# Patient Record
Sex: Male | Born: 2000 | Race: Black or African American | Hispanic: No | Marital: Single | Smoking: Never smoker
Health system: Southern US, Community
[De-identification: ages and names within clinical notes are randomized; demographics above are authoritative.]

## PROBLEM LIST (undated history)

## (undated) DIAGNOSIS — J45909 Unspecified asthma, uncomplicated: Secondary | ICD-10-CM

---
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
## 2021-11-10 ENCOUNTER — Other Ambulatory Visit: Payer: Self-pay

## 2021-11-10 ENCOUNTER — Emergency Department (HOSPITAL_BASED_OUTPATIENT_CLINIC_OR_DEPARTMENT_OTHER)
Admission: EM | Admit: 2021-11-10 | Discharge: 2021-11-10 | Disposition: A | Discharge status: Home / Self Care | Payer: Self-pay | Attending: Emergency Medicine | Admitting: Emergency Medicine

## 2021-11-10 ENCOUNTER — Emergency Department (HOSPITAL_BASED_OUTPATIENT_CLINIC_OR_DEPARTMENT_OTHER): Payer: Self-pay

## 2021-11-10 ENCOUNTER — Encounter (HOSPITAL_BASED_OUTPATIENT_CLINIC_OR_DEPARTMENT_OTHER): Payer: Self-pay

## 2021-11-10 DIAGNOSIS — R051 Acute cough: Secondary | ICD-10-CM

## 2021-11-10 DIAGNOSIS — J45909 Unspecified asthma, uncomplicated: Secondary | ICD-10-CM | POA: Insufficient documentation

## 2021-11-10 DIAGNOSIS — R059 Cough, unspecified: Secondary | ICD-10-CM

## 2021-11-10 DIAGNOSIS — Z20822 Contact with and (suspected) exposure to covid-19: Secondary | ICD-10-CM | POA: Insufficient documentation

## 2021-11-10 DIAGNOSIS — Z28310 Unvaccinated for covid-19: Secondary | ICD-10-CM | POA: Insufficient documentation

## 2021-11-10 DIAGNOSIS — Z91010 Allergy to peanuts: Secondary | ICD-10-CM | POA: Insufficient documentation

## 2021-11-10 DIAGNOSIS — J069 Acute upper respiratory infection, unspecified: Secondary | ICD-10-CM

## 2021-11-10 DIAGNOSIS — R040 Epistaxis: Secondary | ICD-10-CM | POA: Insufficient documentation

## 2021-11-10 LAB — RESP PANEL BY RT-PCR (FLU A&B, COVID) ARPGX2
Influenza A by PCR: NEGATIVE
Influenza B by PCR: NEGATIVE
SARS Coronavirus 2 by RT PCR: NEGATIVE

## 2021-11-10 LAB — GROUP A STREP BY PCR: Group A Strep by PCR: NOT DETECTED

## 2021-11-10 MED ORDER — IBUPROFEN 800 MG PO TABS
800.0000 mg | ORAL_TABLET | Freq: Once | ORAL | Status: AC
  Administered 2021-11-10: 800 mg via ORAL
  Filled 2021-11-10: qty 1

## 2021-11-10 MED ORDER — ALBUTEROL SULFATE HFA 108 (90 BASE) MCG/ACT IN AERS
2.0000 | INHALATION_SPRAY | Freq: Once | RESPIRATORY_TRACT | Status: AC
  Administered 2021-11-10: 2 via RESPIRATORY_TRACT
  Filled 2021-11-10: qty 6.7

## 2021-11-10 MED ORDER — AEROCHAMBER PLUS FLO-VU MEDIUM MISC: 1.0000 | Freq: Once | Status: DC

## 2021-11-10 NOTE — ED Provider Notes (Signed)
[facility] EMERGENCY DEPARTMENT Provider Note   CSN: [id number] Arrival date & time: 11/10/21  1215     History Chief Complaint  Patient presents with   Cough    John Park is a 20 y.o. male who presents with 1 week of runny nose, sore throat, nonproductive cough, body aches, and subjective fever.  He denies any nausea, vomiting, diarrhea.  History of asthma but is not on medications daily.  States he is never had an albuterol inhaler.  He does state that he had significant of blood that he coughed up this morning, however this occurred simultaneously to a nosebleed during which time he felt blood running down the back of his throat.  Favor nosebleed with oral expulsion of the blood rather than true hemoptysis.  No influenza or COVID-19 vaccination this year. HPI     Past Medical History:  Diagnosis Date   Asthma     There are no problems to display for this patient.   History reviewed. No pertinent surgical history.     No family history on file.  Social History   Tobacco Use   Smoking status: Never   Smokeless tobacco: Never  Vaping Use   Vaping Use: Never used  Substance Use Topics   Alcohol use: No   Drug use: No    Home Medications Prior to Admission medications   Medication Sig Start Date End Date Taking? Authorizing Provider  diphenhydrAMINE (BENADRYL) 25 mg capsule Take 1 capsule (25 mg total) by mouth every 6 (six) hours as needed. 01/15/17   Rolland Porter, MD  predniSONE (DELTASONE) 20 MG tablet Take 1 tablet (20 mg total) by mouth 2 (two) times daily with a meal. 01/15/17   Rolland Porter, MD    Allergies    Peanut-containing drug products and Strawberry extract  Review of Systems   Review of Systems  Constitutional:  Positive for activity change, appetite change, chills, fatigue and fever.  HENT:  Positive for congestion, nosebleeds, rhinorrhea and sore throat. Negative for ear pain.   Eyes: Negative.   Respiratory:  Positive for  cough. Negative for choking and shortness of breath.   Cardiovascular: Negative.   Gastrointestinal: Negative.   Genitourinary: Negative.   Musculoskeletal:  Positive for myalgias. Negative for neck pain and neck stiffness.  Skin: Negative.   Neurological:  Positive for headaches. Negative for dizziness, syncope and light-headedness.   Physical Exam Updated Vital Signs BP 112/80   Pulse 89   Temp 100.3 F (37.9 C)   Resp 17   Ht 5\' 11"  (1.803 m)   Wt 70.3 kg   SpO2 99%   BMI 21.62 kg/m   Physical Exam Vitals and nursing note reviewed.  Constitutional:      Appearance: He is not ill-appearing or toxic-appearing.  HENT:     Head: Normocephalic and atraumatic.     Nose: Rhinorrhea present. No congestion. Rhinorrhea is clear.     Left Nostril: Epistaxis present. No septal hematoma.     Mouth/Throat:     Mouth: Mucous membranes are moist.     Pharynx: Oropharynx is clear. Uvula midline. Posterior oropharyngeal erythema present. No oropharyngeal exudate.     Tonsils: No tonsillar exudate.  Eyes:     General: Lids are normal. Vision grossly intact.        Right eye: No discharge.        Left eye: No discharge.     Extraocular Movements: Extraocular movements intact.     Conjunctiva/sclera: Conjunctivae  normal.  Neck:     Trachea: Trachea and phonation normal.  Cardiovascular:     Rate and Rhythm: Normal rate and regular rhythm.     Pulses: Normal pulses.     Heart sounds: Normal heart sounds. No murmur heard. Pulmonary:     Effort: Pulmonary effort is normal. No tachypnea, bradypnea, accessory muscle usage, prolonged expiration, respiratory distress or retractions.     Breath sounds: Examination of the right-middle field reveals wheezing. Examination of the left-middle field reveals wheezing. Examination of the right-lower field reveals wheezing. Examination of the left-lower field reveals wheezing. Wheezing present. No rales.  Chest:     Chest wall: No mass, lacerations,  deformity, swelling, tenderness, crepitus or edema.  Abdominal:     General: Bowel sounds are normal. There is no distension.     Palpations: Abdomen is soft.     Tenderness: There is no abdominal tenderness. There is no right CVA tenderness, left CVA tenderness, guarding or rebound.  Musculoskeletal:        General: No deformity.     Cervical back: Normal range of motion and neck supple. No edema, rigidity, tenderness or crepitus. No pain with movement, spinous process tenderness or muscular tenderness.     Right lower leg: No edema.     Left lower leg: No edema.  Lymphadenopathy:     Cervical: No cervical adenopathy.  Skin:    General: Skin is warm and dry.     Capillary Refill: Capillary refill takes less than 2 seconds.     Findings: No rash.  Neurological:     General: No focal deficit present.     Mental Status: He is alert and oriented to person, place, and time. Mental status is at baseline.     GCS: GCS eye subscore is 4. GCS verbal subscore is 5. GCS motor subscore is 6.     Gait: Gait is intact. Gait normal.  Psychiatric:        Mood and Affect: Mood normal.    ED Results / Procedures / Treatments   Labs (all labs ordered are listed, but only abnormal results are displayed) Labs Reviewed  RESP PANEL BY RT-PCR (FLU A&B, COVID) [id number]  GROUP A STREP BY PCR    EKG None  Radiology DG Chest Port 1 View  Result Date: 11/10/2021 CLINICAL DATA:  Flu-like symptoms for 1 week EXAM: PORTABLE CHEST 1 VIEW COMPARISON:  Chest radiograph 03/04/2015 FINDINGS: The cardiomediastinal silhouette is normal. There is no focal consolidation or pulmonary edema. There is no pleural effusion or pneumothorax. There is no acute osseous abnormality. IMPRESSION: No radiographic evidence of acute cardiopulmonary process. Electronically Signed   By: Lesia Hausen M.D.   On: 11/10/2021 13:43    Procedures Procedures   Medications Ordered in ED Medications  ibuprofen (ADVIL) tablet 800 mg (800  mg Oral Given 11/10/21 1354)  albuterol (VENTOLIN HFA) 108 (90 Base) MCG/ACT inhaler 2 puff (2 puffs Inhalation Given 11/10/21 1357)    ED Course  I have reviewed the triage vital signs and the nursing notes.  Pertinent labs & imaging results that were available during my care of the patient were reviewed by me and considered in my medical decision making (see chart for details).    MDM Rules/Calculators/A&P                         20-year male presents with 6 days of runny nose, sore throat, cough, fatigue, myalgia, and  fever.  Febrile on intake to 100.8 F.  Ibuprofen ordered.  Medicines otherwise normal.  Cardiopulmonary exam revealed diffuse wheezing throughout the mid and lower lung fields bilaterally without expiratory phase prolongation or rhonchi.  Clear rhinorrhea.  Evidence of mild epistaxis in the left naris, hemostatic at this time.  Mild posterior pharyngeal erythema without exudate or edema to suggest oropharyngeal abscess.  Chest x-ray negative for acute cardiopulmonary disease.  Respiratory pathogen panel negative, strep test negative.  Patient administered albuterol with significant improvement in his chest tightness.  On reevaluation patient longer has wheezing in the lung fields and states he is breathing much more comfortably.  May continue to use albuterol at home 2 puffs every 4 hours as necessary.  May also continue Tylenol and ibuprofen as needed for fever and discomfort.  Suspect he has other acute viral URI, no further work-up warranted in the ER at this time given reassuring vital signs and physical exam.  Shemuel voiced understanding of the medical evaluation and treatment plan.  Each of his questions was answered to his expressed satisfaction.  Return precautions were given.  Patient is well-appearing, stable, and appropriate discharge at this time.  This chart was dictated using voice recognition software, Dragon. Despite the best efforts of this provider to  proofread and correct errors, errors may still occur which can change documentation meaning.  Final Clinical Impression(s) / ED Diagnoses Final diagnoses:  Upper respiratory tract infection, unspecified type    Rx / DC Orders ED Discharge Orders     None        Sherrilee Gilles 11/10/21 1553    Terrilee Files, MD 11/10/21 1806

## 2021-11-10 NOTE — Discharge Instructions (Signed)
You are seen in the ER today for your cough, runny nose, and fever.  Your physical exam and vital signs were very reassuring.  You tested negative for COVID-19, influenza.  Suspect you have a different acute viral upper respiratory infection.  You were found to be wheezing in the ER today and were given an albuterol inhaler.  He may continue to use this as needed at home with 2 puffs every 4 hours as needed for your symptoms.  You may take Tylenol and ibuprofen as needed for fever or discomfort.  Come to the ER again if you develop any difficulty breathing, nausea or vomiting that does not stop, or any other new severe symptoms.

## 2021-11-10 NOTE — ED Triage Notes (Signed)
Pt c/o flu like sx x 1week-states he did have some blood in sputum-NAD-steady gait

## 2023-05-27 IMAGING — DX DG CHEST 1V PORT
1 series · 1 of 1 positions shown · non-contrast
Comparison: Chest radiograph 03/04/2015

CLINICAL DATA: Flu-like symptoms for 1 week

EXAM:
PORTABLE CHEST 1 VIEW

[chest ap]
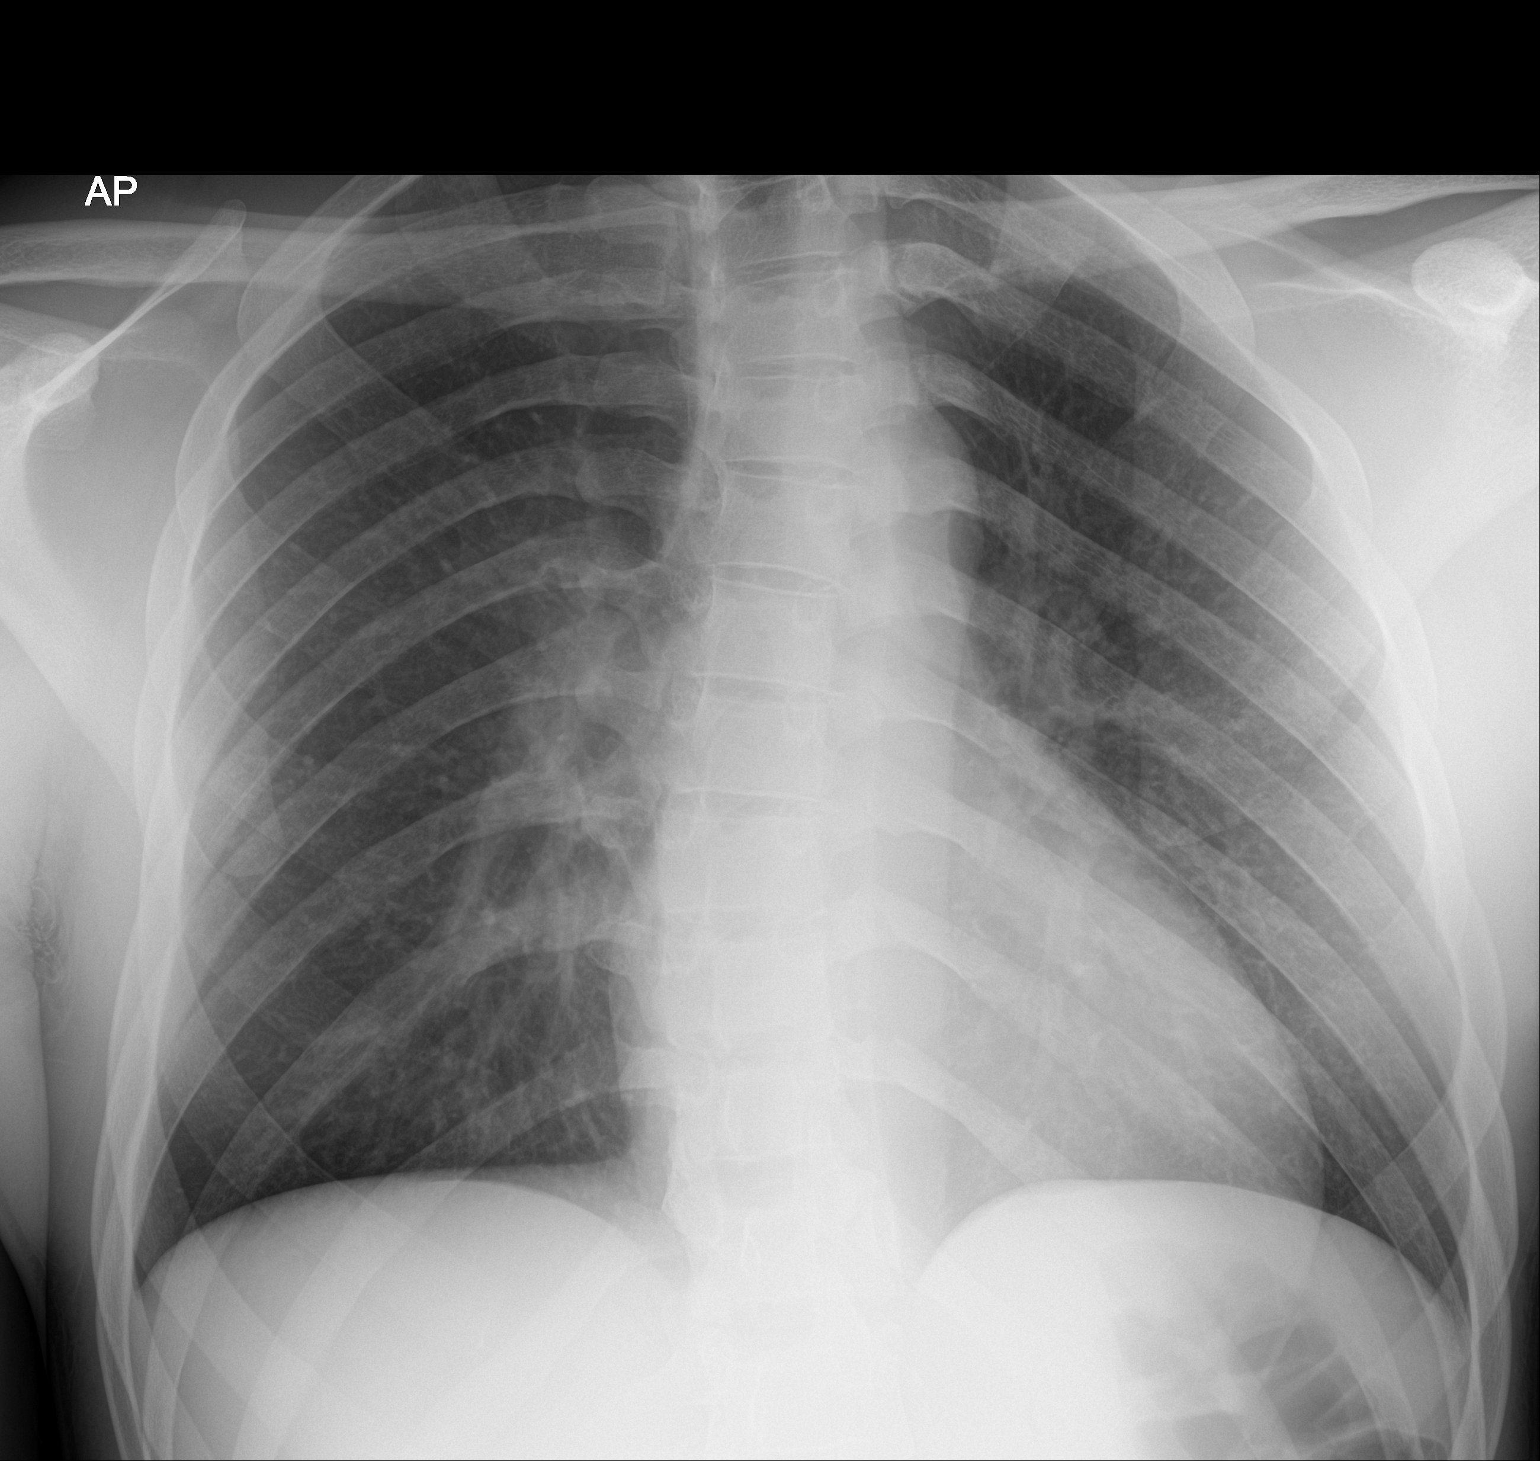

[1 of 1 positions shown; findings below may reference images not displayed]

FINDINGS: The cardiomediastinal silhouette is normal.

There is no focal consolidation or pulmonary edema. There is no
pleural effusion or pneumothorax.

There is no acute osseous abnormality.
IMPRESSION: No radiographic evidence of acute cardiopulmonary process.
